# Patient Record
Sex: Female | Born: 1945
Health system: Southern US, Community
[De-identification: ages and names within clinical notes are randomized; demographics above are authoritative.]

---

## 2000-08-29 ENCOUNTER — Other Ambulatory Visit: Admission: RE | Admit: 2000-08-29 | Discharge: 2000-08-29 | Payer: Self-pay | Admitting: Gynecology

## 2002-09-25 ENCOUNTER — Other Ambulatory Visit: Admission: RE | Admit: 2002-09-25 | Discharge: 2002-09-25 | Payer: Self-pay | Admitting: Gynecology

## 2005-09-30 ENCOUNTER — Other Ambulatory Visit: Admission: RE | Admit: 2005-09-30 | Discharge: 2005-09-30 | Payer: Self-pay | Admitting: Gynecology

## 2006-06-11 ENCOUNTER — Emergency Department (HOSPITAL_COMMUNITY): Admission: EM | Admit: 2006-06-11 | Discharge: 2006-06-11 | Payer: Self-pay | Admitting: Emergency Medicine

## 2006-12-29 ENCOUNTER — Encounter: Admission: RE | Admit: 2006-12-29 | Discharge: 2006-12-29 | Payer: Self-pay | Admitting: Gynecology

## 2007-07-25 ENCOUNTER — Encounter: Admission: RE | Admit: 2007-07-25 | Discharge: 2007-07-25 | Payer: Self-pay | Admitting: Gynecology

## 2007-12-21 ENCOUNTER — Encounter: Admission: RE | Admit: 2007-12-21 | Discharge: 2007-12-21 | Payer: Self-pay | Admitting: Gynecology

## 2008-12-31 ENCOUNTER — Encounter: Admission: RE | Admit: 2008-12-31 | Discharge: 2008-12-31 | Payer: Self-pay | Admitting: Gynecology

## 2010-01-01 ENCOUNTER — Encounter: Admission: RE | Admit: 2010-01-01 | Discharge: 2010-01-01 | Payer: Self-pay | Admitting: Gynecology

## 2011-02-11 ENCOUNTER — Other Ambulatory Visit: Payer: Self-pay | Admitting: Gynecology

## 2011-02-11 DIAGNOSIS — Z1231 Encounter for screening mammogram for malignant neoplasm of breast: Secondary | ICD-10-CM

## 2011-02-23 ENCOUNTER — Ambulatory Visit
Admission: RE | Admit: 2011-02-23 | Discharge: 2011-02-23 | Disposition: A | Discharge status: Home / Self Care | Payer: Medicare Other | Source: Ambulatory Visit | Attending: Gynecology | Admitting: Gynecology

## 2011-02-23 DIAGNOSIS — Z1231 Encounter for screening mammogram for malignant neoplasm of breast: Secondary | ICD-10-CM

## 2012-03-08 ENCOUNTER — Other Ambulatory Visit: Payer: Self-pay | Admitting: Family Medicine

## 2012-03-08 DIAGNOSIS — Z1231 Encounter for screening mammogram for malignant neoplasm of breast: Secondary | ICD-10-CM

## 2012-03-27 ENCOUNTER — Ambulatory Visit
Admission: RE | Admit: 2012-03-27 | Discharge: 2012-03-27 | Disposition: A | Discharge status: Home / Self Care | Payer: Medicare Other | Source: Ambulatory Visit | Attending: Family Medicine | Admitting: Family Medicine

## 2012-03-27 DIAGNOSIS — Z1231 Encounter for screening mammogram for malignant neoplasm of breast: Secondary | ICD-10-CM

## 2012-08-30 DIAGNOSIS — J159 Unspecified bacterial pneumonia: Secondary | ICD-10-CM | POA: Diagnosis not present

## 2012-09-21 DIAGNOSIS — H52229 Regular astigmatism, unspecified eye: Secondary | ICD-10-CM | POA: Diagnosis not present

## 2012-09-21 DIAGNOSIS — H04129 Dry eye syndrome of unspecified lacrimal gland: Secondary | ICD-10-CM | POA: Diagnosis not present

## 2012-10-23 DIAGNOSIS — D485 Neoplasm of uncertain behavior of skin: Secondary | ICD-10-CM | POA: Diagnosis not present

## 2012-10-23 DIAGNOSIS — L821 Other seborrheic keratosis: Secondary | ICD-10-CM | POA: Diagnosis not present

## 2012-11-14 DIAGNOSIS — Z79899 Other long term (current) drug therapy: Secondary | ICD-10-CM | POA: Diagnosis not present

## 2012-11-14 DIAGNOSIS — E782 Mixed hyperlipidemia: Secondary | ICD-10-CM | POA: Diagnosis not present

## 2012-11-22 DIAGNOSIS — Z0000 Encounter for general adult medical examination without abnormal findings: Secondary | ICD-10-CM | POA: Diagnosis not present

## 2012-11-22 DIAGNOSIS — E782 Mixed hyperlipidemia: Secondary | ICD-10-CM | POA: Diagnosis not present

## 2012-11-22 DIAGNOSIS — I10 Essential (primary) hypertension: Secondary | ICD-10-CM | POA: Diagnosis not present

## 2013-03-23 ENCOUNTER — Other Ambulatory Visit: Payer: Self-pay

## 2013-03-23 DIAGNOSIS — Z1231 Encounter for screening mammogram for malignant neoplasm of breast: Secondary | ICD-10-CM

## 2013-04-03 DIAGNOSIS — J0100 Acute maxillary sinusitis, unspecified: Secondary | ICD-10-CM | POA: Diagnosis not present

## 2013-04-05 ENCOUNTER — Ambulatory Visit
Admission: RE | Admit: 2013-04-05 | Discharge: 2013-04-05 | Disposition: A | Discharge status: Home / Self Care | Payer: Medicare Other | Source: Ambulatory Visit

## 2013-04-05 DIAGNOSIS — Z1231 Encounter for screening mammogram for malignant neoplasm of breast: Secondary | ICD-10-CM | POA: Diagnosis not present

## 2013-04-11 ENCOUNTER — Other Ambulatory Visit: Payer: Self-pay | Admitting: Family Medicine

## 2013-04-11 DIAGNOSIS — R928 Other abnormal and inconclusive findings on diagnostic imaging of breast: Secondary | ICD-10-CM

## 2013-04-17 ENCOUNTER — Ambulatory Visit
Admission: RE | Admit: 2013-04-17 | Discharge: 2013-04-17 | Disposition: A | Discharge status: Home / Self Care | Payer: Medicare Other | Source: Ambulatory Visit | Attending: Family Medicine | Admitting: Family Medicine

## 2013-04-17 DIAGNOSIS — R928 Other abnormal and inconclusive findings on diagnostic imaging of breast: Secondary | ICD-10-CM

## 2013-04-30 ENCOUNTER — Other Ambulatory Visit: Payer: Medicare Other

## 2013-05-23 DIAGNOSIS — I10 Essential (primary) hypertension: Secondary | ICD-10-CM | POA: Diagnosis not present

## 2013-05-23 DIAGNOSIS — Z79899 Other long term (current) drug therapy: Secondary | ICD-10-CM | POA: Diagnosis not present

## 2013-05-30 ENCOUNTER — Other Ambulatory Visit: Payer: Self-pay | Admitting: Family Medicine

## 2013-05-30 DIAGNOSIS — E782 Mixed hyperlipidemia: Secondary | ICD-10-CM | POA: Diagnosis not present

## 2013-05-30 DIAGNOSIS — Z79899 Other long term (current) drug therapy: Secondary | ICD-10-CM | POA: Diagnosis not present

## 2013-05-30 DIAGNOSIS — I10 Essential (primary) hypertension: Secondary | ICD-10-CM | POA: Diagnosis not present

## 2013-05-30 DIAGNOSIS — N63 Unspecified lump in unspecified breast: Secondary | ICD-10-CM | POA: Diagnosis not present

## 2013-05-30 DIAGNOSIS — N6310 Unspecified lump in the right breast, unspecified quadrant: Secondary | ICD-10-CM

## 2013-05-30 DIAGNOSIS — Z23 Encounter for immunization: Secondary | ICD-10-CM | POA: Diagnosis not present

## 2013-06-14 ENCOUNTER — Ambulatory Visit
Admission: RE | Admit: 2013-06-14 | Discharge: 2013-06-14 | Disposition: A | Discharge status: Home / Self Care | Payer: Medicare Other | Source: Ambulatory Visit | Attending: Family Medicine | Admitting: Family Medicine

## 2013-06-14 DIAGNOSIS — N6310 Unspecified lump in the right breast, unspecified quadrant: Secondary | ICD-10-CM

## 2013-06-14 DIAGNOSIS — R928 Other abnormal and inconclusive findings on diagnostic imaging of breast: Secondary | ICD-10-CM | POA: Diagnosis not present

## 2013-06-20 DIAGNOSIS — R82998 Other abnormal findings in urine: Secondary | ICD-10-CM | POA: Diagnosis not present

## 2013-06-20 DIAGNOSIS — Z1211 Encounter for screening for malignant neoplasm of colon: Secondary | ICD-10-CM | POA: Diagnosis not present

## 2013-06-20 DIAGNOSIS — Z1272 Encounter for screening for malignant neoplasm of vagina: Secondary | ICD-10-CM | POA: Diagnosis not present

## 2013-06-20 DIAGNOSIS — Z1289 Encounter for screening for malignant neoplasm of other sites: Secondary | ICD-10-CM | POA: Diagnosis not present

## 2013-06-21 DIAGNOSIS — Z006 Encounter for examination for normal comparison and control in clinical research program: Secondary | ICD-10-CM | POA: Diagnosis not present

## 2013-06-21 DIAGNOSIS — E782 Mixed hyperlipidemia: Secondary | ICD-10-CM | POA: Diagnosis not present

## 2013-06-21 DIAGNOSIS — I10 Essential (primary) hypertension: Secondary | ICD-10-CM | POA: Diagnosis not present

## 2014-01-24 DIAGNOSIS — D235 Other benign neoplasm of skin of trunk: Secondary | ICD-10-CM | POA: Diagnosis not present

## 2014-01-24 DIAGNOSIS — L821 Other seborrheic keratosis: Secondary | ICD-10-CM | POA: Diagnosis not present

## 2014-04-29 ENCOUNTER — Other Ambulatory Visit: Payer: Self-pay

## 2014-04-29 DIAGNOSIS — Z1231 Encounter for screening mammogram for malignant neoplasm of breast: Secondary | ICD-10-CM

## 2014-05-24 ENCOUNTER — Ambulatory Visit
Admission: RE | Admit: 2014-05-24 | Discharge: 2014-05-24 | Disposition: A | Discharge status: Home / Self Care | Payer: Medicare Other | Source: Ambulatory Visit

## 2014-05-24 DIAGNOSIS — Z1231 Encounter for screening mammogram for malignant neoplasm of breast: Secondary | ICD-10-CM | POA: Diagnosis not present

## 2014-06-13 DIAGNOSIS — J069 Acute upper respiratory infection, unspecified: Secondary | ICD-10-CM | POA: Diagnosis not present

## 2014-06-13 DIAGNOSIS — J209 Acute bronchitis, unspecified: Secondary | ICD-10-CM | POA: Diagnosis not present

## 2014-06-13 DIAGNOSIS — R062 Wheezing: Secondary | ICD-10-CM | POA: Diagnosis not present

## 2014-06-26 DIAGNOSIS — E782 Mixed hyperlipidemia: Secondary | ICD-10-CM | POA: Diagnosis not present

## 2014-06-26 DIAGNOSIS — E039 Hypothyroidism, unspecified: Secondary | ICD-10-CM | POA: Diagnosis not present

## 2014-06-26 DIAGNOSIS — E559 Vitamin D deficiency, unspecified: Secondary | ICD-10-CM | POA: Diagnosis not present

## 2014-06-26 DIAGNOSIS — Z79899 Other long term (current) drug therapy: Secondary | ICD-10-CM | POA: Diagnosis not present

## 2014-07-03 DIAGNOSIS — I10 Essential (primary) hypertension: Secondary | ICD-10-CM | POA: Diagnosis not present

## 2014-07-03 DIAGNOSIS — I059 Rheumatic mitral valve disease, unspecified: Secondary | ICD-10-CM | POA: Diagnosis not present

## 2014-07-03 DIAGNOSIS — Z0000 Encounter for general adult medical examination without abnormal findings: Secondary | ICD-10-CM | POA: Diagnosis not present

## 2014-07-03 DIAGNOSIS — Z23 Encounter for immunization: Secondary | ICD-10-CM | POA: Diagnosis not present

## 2014-07-04 DIAGNOSIS — Z1211 Encounter for screening for malignant neoplasm of colon: Secondary | ICD-10-CM | POA: Diagnosis not present

## 2014-07-09 DIAGNOSIS — I361 Nonrheumatic tricuspid (valve) insufficiency: Secondary | ICD-10-CM | POA: Diagnosis not present

## 2014-07-09 DIAGNOSIS — I059 Rheumatic mitral valve disease, unspecified: Secondary | ICD-10-CM | POA: Diagnosis not present

## 2014-08-01 DIAGNOSIS — E782 Mixed hyperlipidemia: Secondary | ICD-10-CM | POA: Diagnosis not present

## 2014-08-01 DIAGNOSIS — I10 Essential (primary) hypertension: Secondary | ICD-10-CM | POA: Diagnosis not present

## 2014-09-23 DIAGNOSIS — E782 Mixed hyperlipidemia: Secondary | ICD-10-CM | POA: Diagnosis not present

## 2014-09-23 DIAGNOSIS — Z23 Encounter for immunization: Secondary | ICD-10-CM | POA: Diagnosis not present

## 2014-09-23 DIAGNOSIS — Z79899 Other long term (current) drug therapy: Secondary | ICD-10-CM | POA: Diagnosis not present

## 2014-09-23 DIAGNOSIS — I10 Essential (primary) hypertension: Secondary | ICD-10-CM | POA: Diagnosis not present

## 2014-09-24 DIAGNOSIS — E782 Mixed hyperlipidemia: Secondary | ICD-10-CM | POA: Diagnosis not present

## 2014-09-24 DIAGNOSIS — Z79899 Other long term (current) drug therapy: Secondary | ICD-10-CM | POA: Diagnosis not present

## 2014-10-23 DIAGNOSIS — I10 Essential (primary) hypertension: Secondary | ICD-10-CM | POA: Diagnosis not present

## 2014-10-23 DIAGNOSIS — N183 Chronic kidney disease, stage 3 (moderate): Secondary | ICD-10-CM | POA: Diagnosis not present

## 2014-10-23 DIAGNOSIS — Z9181 History of falling: Secondary | ICD-10-CM | POA: Diagnosis not present

## 2015-01-01 DIAGNOSIS — Z79899 Other long term (current) drug therapy: Secondary | ICD-10-CM | POA: Diagnosis not present

## 2015-01-08 DIAGNOSIS — E782 Mixed hyperlipidemia: Secondary | ICD-10-CM | POA: Diagnosis not present

## 2015-01-08 DIAGNOSIS — N183 Chronic kidney disease, stage 3 (moderate): Secondary | ICD-10-CM | POA: Diagnosis not present

## 2015-01-28 DIAGNOSIS — C44319 Basal cell carcinoma of skin of other parts of face: Secondary | ICD-10-CM | POA: Diagnosis not present

## 2015-01-28 DIAGNOSIS — L821 Other seborrheic keratosis: Secondary | ICD-10-CM | POA: Diagnosis not present

## 2015-01-28 DIAGNOSIS — L728 Other follicular cysts of the skin and subcutaneous tissue: Secondary | ICD-10-CM | POA: Diagnosis not present

## 2015-01-28 DIAGNOSIS — L820 Inflamed seborrheic keratosis: Secondary | ICD-10-CM | POA: Diagnosis not present

## 2015-02-07 DIAGNOSIS — N183 Chronic kidney disease, stage 3 (moderate): Secondary | ICD-10-CM | POA: Diagnosis not present

## 2015-02-07 DIAGNOSIS — E785 Hyperlipidemia, unspecified: Secondary | ICD-10-CM | POA: Diagnosis not present

## 2015-02-07 DIAGNOSIS — N189 Chronic kidney disease, unspecified: Secondary | ICD-10-CM | POA: Diagnosis not present

## 2015-02-07 DIAGNOSIS — I129 Hypertensive chronic kidney disease with stage 1 through stage 4 chronic kidney disease, or unspecified chronic kidney disease: Secondary | ICD-10-CM | POA: Diagnosis not present

## 2015-03-10 DIAGNOSIS — E782 Mixed hyperlipidemia: Secondary | ICD-10-CM | POA: Diagnosis not present

## 2015-03-10 DIAGNOSIS — Z79899 Other long term (current) drug therapy: Secondary | ICD-10-CM | POA: Diagnosis not present

## 2015-03-10 DIAGNOSIS — N183 Chronic kidney disease, stage 3 (moderate): Secondary | ICD-10-CM | POA: Diagnosis not present

## 2015-03-25 DIAGNOSIS — L570 Actinic keratosis: Secondary | ICD-10-CM | POA: Diagnosis not present

## 2015-03-25 DIAGNOSIS — B351 Tinea unguium: Secondary | ICD-10-CM | POA: Diagnosis not present

## 2015-03-25 DIAGNOSIS — L821 Other seborrheic keratosis: Secondary | ICD-10-CM | POA: Diagnosis not present

## 2015-03-25 DIAGNOSIS — D485 Neoplasm of uncertain behavior of skin: Secondary | ICD-10-CM | POA: Diagnosis not present

## 2015-03-26 DIAGNOSIS — H04123 Dry eye syndrome of bilateral lacrimal glands: Secondary | ICD-10-CM | POA: Diagnosis not present

## 2015-04-07 DIAGNOSIS — N289 Disorder of kidney and ureter, unspecified: Secondary | ICD-10-CM | POA: Diagnosis not present

## 2015-04-07 DIAGNOSIS — Z79899 Other long term (current) drug therapy: Secondary | ICD-10-CM | POA: Diagnosis not present

## 2015-04-07 DIAGNOSIS — B351 Tinea unguium: Secondary | ICD-10-CM | POA: Diagnosis not present

## 2015-04-07 DIAGNOSIS — E782 Mixed hyperlipidemia: Secondary | ICD-10-CM | POA: Diagnosis not present

## 2015-04-14 DIAGNOSIS — N183 Chronic kidney disease, stage 3 (moderate): Secondary | ICD-10-CM | POA: Diagnosis not present

## 2015-04-14 DIAGNOSIS — Z23 Encounter for immunization: Secondary | ICD-10-CM | POA: Diagnosis not present

## 2015-04-14 DIAGNOSIS — E782 Mixed hyperlipidemia: Secondary | ICD-10-CM | POA: Diagnosis not present

## 2015-05-09 DIAGNOSIS — N183 Chronic kidney disease, stage 3 (moderate): Secondary | ICD-10-CM | POA: Diagnosis not present

## 2015-05-16 DIAGNOSIS — N179 Acute kidney failure, unspecified: Secondary | ICD-10-CM | POA: Diagnosis not present

## 2015-05-16 DIAGNOSIS — I10 Essential (primary) hypertension: Secondary | ICD-10-CM | POA: Diagnosis not present

## 2015-06-16 ENCOUNTER — Other Ambulatory Visit: Payer: Self-pay

## 2015-06-16 DIAGNOSIS — Z1231 Encounter for screening mammogram for malignant neoplasm of breast: Secondary | ICD-10-CM

## 2015-06-26 DIAGNOSIS — D1801 Hemangioma of skin and subcutaneous tissue: Secondary | ICD-10-CM | POA: Diagnosis not present

## 2015-06-26 DIAGNOSIS — D485 Neoplasm of uncertain behavior of skin: Secondary | ICD-10-CM | POA: Diagnosis not present

## 2015-06-26 DIAGNOSIS — B351 Tinea unguium: Secondary | ICD-10-CM | POA: Diagnosis not present

## 2015-06-26 DIAGNOSIS — L570 Actinic keratosis: Secondary | ICD-10-CM | POA: Diagnosis not present

## 2015-07-18 ENCOUNTER — Ambulatory Visit
Admission: RE | Admit: 2015-07-18 | Discharge: 2015-07-18 | Disposition: A | Discharge status: Home / Self Care | Payer: Medicare Other | Source: Ambulatory Visit

## 2015-07-18 DIAGNOSIS — Z1231 Encounter for screening mammogram for malignant neoplasm of breast: Secondary | ICD-10-CM | POA: Diagnosis not present

## 2015-07-23 DIAGNOSIS — Z01419 Encounter for gynecological examination (general) (routine) without abnormal findings: Secondary | ICD-10-CM | POA: Diagnosis not present

## 2015-07-23 DIAGNOSIS — Z1272 Encounter for screening for malignant neoplasm of vagina: Secondary | ICD-10-CM | POA: Diagnosis not present

## 2015-09-18 DIAGNOSIS — Z79899 Other long term (current) drug therapy: Secondary | ICD-10-CM | POA: Diagnosis not present

## 2015-09-18 DIAGNOSIS — E782 Mixed hyperlipidemia: Secondary | ICD-10-CM | POA: Diagnosis not present

## 2015-09-18 DIAGNOSIS — E039 Hypothyroidism, unspecified: Secondary | ICD-10-CM | POA: Diagnosis not present

## 2015-09-25 DIAGNOSIS — E039 Hypothyroidism, unspecified: Secondary | ICD-10-CM | POA: Diagnosis not present

## 2015-09-25 DIAGNOSIS — Z0000 Encounter for general adult medical examination without abnormal findings: Secondary | ICD-10-CM | POA: Diagnosis not present

## 2015-09-25 DIAGNOSIS — I10 Essential (primary) hypertension: Secondary | ICD-10-CM | POA: Diagnosis not present

## 2015-09-25 DIAGNOSIS — E782 Mixed hyperlipidemia: Secondary | ICD-10-CM | POA: Diagnosis not present

## 2015-09-25 DIAGNOSIS — N183 Chronic kidney disease, stage 3 (moderate): Secondary | ICD-10-CM | POA: Diagnosis not present

## 2015-11-07 DIAGNOSIS — N179 Acute kidney failure, unspecified: Secondary | ICD-10-CM | POA: Diagnosis not present

## 2015-11-14 DIAGNOSIS — N183 Chronic kidney disease, stage 3 unspecified: Secondary | ICD-10-CM | POA: Insufficient documentation

## 2015-11-14 DIAGNOSIS — I129 Hypertensive chronic kidney disease with stage 1 through stage 4 chronic kidney disease, or unspecified chronic kidney disease: Secondary | ICD-10-CM | POA: Diagnosis not present

## 2016-01-29 DIAGNOSIS — L814 Other melanin hyperpigmentation: Secondary | ICD-10-CM | POA: Diagnosis not present

## 2016-01-29 DIAGNOSIS — D1801 Hemangioma of skin and subcutaneous tissue: Secondary | ICD-10-CM | POA: Diagnosis not present

## 2016-01-29 DIAGNOSIS — L821 Other seborrheic keratosis: Secondary | ICD-10-CM | POA: Diagnosis not present

## 2016-03-01 DIAGNOSIS — E782 Mixed hyperlipidemia: Secondary | ICD-10-CM | POA: Diagnosis not present

## 2016-03-03 DIAGNOSIS — Z9181 History of falling: Secondary | ICD-10-CM | POA: Diagnosis not present

## 2016-03-03 DIAGNOSIS — N289 Disorder of kidney and ureter, unspecified: Secondary | ICD-10-CM | POA: Diagnosis not present

## 2016-03-03 DIAGNOSIS — I10 Essential (primary) hypertension: Secondary | ICD-10-CM | POA: Diagnosis not present

## 2016-03-03 DIAGNOSIS — Z1389 Encounter for screening for other disorder: Secondary | ICD-10-CM | POA: Diagnosis not present

## 2016-05-25 DIAGNOSIS — J0100 Acute maxillary sinusitis, unspecified: Secondary | ICD-10-CM | POA: Diagnosis not present

## 2016-06-30 DIAGNOSIS — Z23 Encounter for immunization: Secondary | ICD-10-CM | POA: Diagnosis not present

## 2016-07-10 DIAGNOSIS — N183 Chronic kidney disease, stage 3 (moderate): Secondary | ICD-10-CM | POA: Diagnosis not present

## 2016-07-19 DIAGNOSIS — I129 Hypertensive chronic kidney disease with stage 1 through stage 4 chronic kidney disease, or unspecified chronic kidney disease: Secondary | ICD-10-CM | POA: Diagnosis not present

## 2016-07-19 DIAGNOSIS — E559 Vitamin D deficiency, unspecified: Secondary | ICD-10-CM | POA: Diagnosis not present

## 2016-07-19 DIAGNOSIS — N183 Chronic kidney disease, stage 3 (moderate): Secondary | ICD-10-CM | POA: Diagnosis not present

## 2016-07-27 ENCOUNTER — Other Ambulatory Visit: Payer: Self-pay | Admitting: Family Medicine

## 2016-07-27 DIAGNOSIS — Z1231 Encounter for screening mammogram for malignant neoplasm of breast: Secondary | ICD-10-CM

## 2016-09-10 ENCOUNTER — Ambulatory Visit
Admission: RE | Admit: 2016-09-10 | Discharge: 2016-09-10 | Disposition: A | Discharge status: Home / Self Care | Payer: Medicare Other | Source: Ambulatory Visit | Attending: Family Medicine | Admitting: Family Medicine

## 2016-09-10 DIAGNOSIS — Z1231 Encounter for screening mammogram for malignant neoplasm of breast: Secondary | ICD-10-CM | POA: Diagnosis not present

## 2016-09-28 DIAGNOSIS — E782 Mixed hyperlipidemia: Secondary | ICD-10-CM | POA: Diagnosis not present

## 2016-09-28 DIAGNOSIS — Z79899 Other long term (current) drug therapy: Secondary | ICD-10-CM | POA: Diagnosis not present

## 2016-10-04 DIAGNOSIS — Z79899 Other long term (current) drug therapy: Secondary | ICD-10-CM | POA: Diagnosis not present

## 2016-10-04 DIAGNOSIS — I059 Rheumatic mitral valve disease, unspecified: Secondary | ICD-10-CM | POA: Diagnosis not present

## 2016-10-04 DIAGNOSIS — M8580 Other specified disorders of bone density and structure, unspecified site: Secondary | ICD-10-CM | POA: Diagnosis not present

## 2016-10-04 DIAGNOSIS — E039 Hypothyroidism, unspecified: Secondary | ICD-10-CM | POA: Diagnosis not present

## 2016-10-04 DIAGNOSIS — E782 Mixed hyperlipidemia: Secondary | ICD-10-CM | POA: Diagnosis not present

## 2016-10-04 DIAGNOSIS — I10 Essential (primary) hypertension: Secondary | ICD-10-CM | POA: Diagnosis not present

## 2016-10-04 DIAGNOSIS — E559 Vitamin D deficiency, unspecified: Secondary | ICD-10-CM | POA: Diagnosis not present

## 2016-10-04 DIAGNOSIS — Z0000 Encounter for general adult medical examination without abnormal findings: Secondary | ICD-10-CM | POA: Diagnosis not present

## 2016-10-04 DIAGNOSIS — N183 Chronic kidney disease, stage 3 (moderate): Secondary | ICD-10-CM | POA: Diagnosis not present

## 2016-10-08 DIAGNOSIS — Z1211 Encounter for screening for malignant neoplasm of colon: Secondary | ICD-10-CM | POA: Diagnosis not present

## 2016-12-27 DIAGNOSIS — Z79899 Other long term (current) drug therapy: Secondary | ICD-10-CM | POA: Diagnosis not present

## 2016-12-27 DIAGNOSIS — E782 Mixed hyperlipidemia: Secondary | ICD-10-CM | POA: Diagnosis not present

## 2017-01-03 DIAGNOSIS — I129 Hypertensive chronic kidney disease with stage 1 through stage 4 chronic kidney disease, or unspecified chronic kidney disease: Secondary | ICD-10-CM | POA: Diagnosis not present

## 2017-01-03 DIAGNOSIS — N183 Chronic kidney disease, stage 3 (moderate): Secondary | ICD-10-CM | POA: Diagnosis not present

## 2017-01-03 DIAGNOSIS — E782 Mixed hyperlipidemia: Secondary | ICD-10-CM | POA: Diagnosis not present

## 2017-01-03 DIAGNOSIS — I059 Rheumatic mitral valve disease, unspecified: Secondary | ICD-10-CM | POA: Diagnosis not present

## 2017-01-06 DIAGNOSIS — R5381 Other malaise: Secondary | ICD-10-CM | POA: Diagnosis not present

## 2017-01-06 DIAGNOSIS — N3000 Acute cystitis without hematuria: Secondary | ICD-10-CM | POA: Diagnosis not present

## 2017-01-06 DIAGNOSIS — R1030 Lower abdominal pain, unspecified: Secondary | ICD-10-CM | POA: Diagnosis not present

## 2017-01-10 DIAGNOSIS — J984 Other disorders of lung: Secondary | ICD-10-CM | POA: Diagnosis not present

## 2017-01-10 DIAGNOSIS — I10 Essential (primary) hypertension: Secondary | ICD-10-CM | POA: Diagnosis not present

## 2017-01-10 DIAGNOSIS — R911 Solitary pulmonary nodule: Secondary | ICD-10-CM | POA: Diagnosis not present

## 2017-01-10 DIAGNOSIS — E785 Hyperlipidemia, unspecified: Secondary | ICD-10-CM | POA: Diagnosis not present

## 2017-01-10 DIAGNOSIS — R42 Dizziness and giddiness: Secondary | ICD-10-CM | POA: Diagnosis not present

## 2017-01-10 DIAGNOSIS — R079 Chest pain, unspecified: Secondary | ICD-10-CM | POA: Diagnosis not present

## 2017-01-10 DIAGNOSIS — R7989 Other specified abnormal findings of blood chemistry: Secondary | ICD-10-CM | POA: Diagnosis not present

## 2017-01-10 DIAGNOSIS — N179 Acute kidney failure, unspecified: Secondary | ICD-10-CM | POA: Diagnosis not present

## 2017-01-10 DIAGNOSIS — R000 Tachycardia, unspecified: Secondary | ICD-10-CM | POA: Diagnosis not present

## 2017-01-10 DIAGNOSIS — I471 Supraventricular tachycardia, unspecified: Secondary | ICD-10-CM | POA: Insufficient documentation

## 2017-01-10 DIAGNOSIS — R938 Abnormal findings on diagnostic imaging of other specified body structures: Secondary | ICD-10-CM | POA: Diagnosis not present

## 2017-01-10 DIAGNOSIS — R748 Abnormal levels of other serum enzymes: Secondary | ICD-10-CM | POA: Diagnosis not present

## 2017-01-10 DIAGNOSIS — R9389 Abnormal findings on diagnostic imaging of other specified body structures: Secondary | ICD-10-CM | POA: Insufficient documentation

## 2017-01-10 DIAGNOSIS — R918 Other nonspecific abnormal finding of lung field: Secondary | ICD-10-CM | POA: Diagnosis not present

## 2017-01-11 DIAGNOSIS — R911 Solitary pulmonary nodule: Secondary | ICD-10-CM | POA: Diagnosis not present

## 2017-01-11 DIAGNOSIS — R918 Other nonspecific abnormal finding of lung field: Secondary | ICD-10-CM | POA: Diagnosis not present

## 2017-01-11 DIAGNOSIS — I351 Nonrheumatic aortic (valve) insufficiency: Secondary | ICD-10-CM | POA: Diagnosis not present

## 2017-01-11 DIAGNOSIS — R7989 Other specified abnormal findings of blood chemistry: Secondary | ICD-10-CM | POA: Diagnosis not present

## 2017-01-11 DIAGNOSIS — I471 Supraventricular tachycardia: Secondary | ICD-10-CM | POA: Diagnosis not present

## 2017-01-11 DIAGNOSIS — J189 Pneumonia, unspecified organism: Secondary | ICD-10-CM | POA: Diagnosis not present

## 2017-01-11 DIAGNOSIS — R748 Abnormal levels of other serum enzymes: Secondary | ICD-10-CM | POA: Diagnosis not present

## 2017-01-11 DIAGNOSIS — E785 Hyperlipidemia, unspecified: Secondary | ICD-10-CM | POA: Diagnosis not present

## 2017-01-11 DIAGNOSIS — I10 Essential (primary) hypertension: Secondary | ICD-10-CM | POA: Diagnosis not present

## 2017-01-11 DIAGNOSIS — N179 Acute kidney failure, unspecified: Secondary | ICD-10-CM | POA: Diagnosis not present

## 2017-01-11 DIAGNOSIS — I368 Other nonrheumatic tricuspid valve disorders: Secondary | ICD-10-CM | POA: Diagnosis not present

## 2017-01-14 DIAGNOSIS — R938 Abnormal findings on diagnostic imaging of other specified body structures: Secondary | ICD-10-CM | POA: Diagnosis not present

## 2017-01-14 DIAGNOSIS — Z8679 Personal history of other diseases of the circulatory system: Secondary | ICD-10-CM | POA: Diagnosis not present

## 2017-01-14 DIAGNOSIS — Z6821 Body mass index (BMI) 21.0-21.9, adult: Secondary | ICD-10-CM | POA: Diagnosis not present

## 2017-01-26 DIAGNOSIS — I471 Supraventricular tachycardia: Secondary | ICD-10-CM | POA: Diagnosis not present

## 2017-02-23 DIAGNOSIS — I471 Supraventricular tachycardia: Secondary | ICD-10-CM | POA: Diagnosis not present

## 2017-02-23 DIAGNOSIS — Z8679 Personal history of other diseases of the circulatory system: Secondary | ICD-10-CM | POA: Diagnosis not present

## 2017-02-23 DIAGNOSIS — I10 Essential (primary) hypertension: Secondary | ICD-10-CM | POA: Insufficient documentation

## 2017-02-23 DIAGNOSIS — E785 Hyperlipidemia, unspecified: Secondary | ICD-10-CM | POA: Insufficient documentation

## 2017-02-23 DIAGNOSIS — E784 Other hyperlipidemia: Secondary | ICD-10-CM | POA: Diagnosis not present

## 2017-02-23 DIAGNOSIS — N183 Chronic kidney disease, stage 3 (moderate): Secondary | ICD-10-CM | POA: Diagnosis not present

## 2017-03-10 DIAGNOSIS — L570 Actinic keratosis: Secondary | ICD-10-CM | POA: Diagnosis not present

## 2017-03-10 DIAGNOSIS — L821 Other seborrheic keratosis: Secondary | ICD-10-CM | POA: Diagnosis not present

## 2017-03-10 DIAGNOSIS — L814 Other melanin hyperpigmentation: Secondary | ICD-10-CM | POA: Diagnosis not present

## 2017-03-10 DIAGNOSIS — L300 Nummular dermatitis: Secondary | ICD-10-CM | POA: Diagnosis not present

## 2017-03-29 DIAGNOSIS — E559 Vitamin D deficiency, unspecified: Secondary | ICD-10-CM | POA: Diagnosis not present

## 2017-03-29 DIAGNOSIS — E782 Mixed hyperlipidemia: Secondary | ICD-10-CM | POA: Diagnosis not present

## 2017-03-29 DIAGNOSIS — Z79899 Other long term (current) drug therapy: Secondary | ICD-10-CM | POA: Diagnosis not present

## 2017-04-04 DIAGNOSIS — I129 Hypertensive chronic kidney disease with stage 1 through stage 4 chronic kidney disease, or unspecified chronic kidney disease: Secondary | ICD-10-CM | POA: Diagnosis not present

## 2017-04-04 DIAGNOSIS — N183 Chronic kidney disease, stage 3 (moderate): Secondary | ICD-10-CM | POA: Diagnosis not present

## 2017-04-04 DIAGNOSIS — Z1389 Encounter for screening for other disorder: Secondary | ICD-10-CM | POA: Diagnosis not present

## 2017-04-04 DIAGNOSIS — Z9181 History of falling: Secondary | ICD-10-CM | POA: Diagnosis not present

## 2017-06-06 DIAGNOSIS — H2513 Age-related nuclear cataract, bilateral: Secondary | ICD-10-CM | POA: Diagnosis not present

## 2017-06-06 DIAGNOSIS — H5203 Hypermetropia, bilateral: Secondary | ICD-10-CM | POA: Diagnosis not present

## 2017-06-10 DIAGNOSIS — R918 Other nonspecific abnormal finding of lung field: Secondary | ICD-10-CM | POA: Diagnosis not present

## 2017-06-30 DIAGNOSIS — L650 Telogen effluvium: Secondary | ICD-10-CM | POA: Diagnosis not present

## 2017-07-12 DIAGNOSIS — N183 Chronic kidney disease, stage 3 (moderate): Secondary | ICD-10-CM | POA: Diagnosis not present

## 2017-07-12 DIAGNOSIS — I129 Hypertensive chronic kidney disease with stage 1 through stage 4 chronic kidney disease, or unspecified chronic kidney disease: Secondary | ICD-10-CM | POA: Diagnosis not present

## 2017-07-12 DIAGNOSIS — I471 Supraventricular tachycardia: Secondary | ICD-10-CM | POA: Diagnosis not present

## 2017-07-26 DIAGNOSIS — Z79899 Other long term (current) drug therapy: Secondary | ICD-10-CM | POA: Diagnosis not present

## 2017-07-26 DIAGNOSIS — E039 Hypothyroidism, unspecified: Secondary | ICD-10-CM | POA: Diagnosis not present

## 2017-07-26 DIAGNOSIS — E782 Mixed hyperlipidemia: Secondary | ICD-10-CM | POA: Diagnosis not present

## 2017-07-26 DIAGNOSIS — Z23 Encounter for immunization: Secondary | ICD-10-CM | POA: Diagnosis not present

## 2017-08-03 DIAGNOSIS — M859 Disorder of bone density and structure, unspecified: Secondary | ICD-10-CM | POA: Diagnosis not present

## 2017-08-03 DIAGNOSIS — Z13820 Encounter for screening for osteoporosis: Secondary | ICD-10-CM | POA: Diagnosis not present

## 2017-08-03 DIAGNOSIS — Z2821 Immunization not carried out because of patient refusal: Secondary | ICD-10-CM | POA: Diagnosis not present

## 2017-08-03 DIAGNOSIS — I10 Essential (primary) hypertension: Secondary | ICD-10-CM | POA: Diagnosis not present

## 2017-08-03 DIAGNOSIS — E782 Mixed hyperlipidemia: Secondary | ICD-10-CM | POA: Diagnosis not present

## 2017-08-03 DIAGNOSIS — M8580 Other specified disorders of bone density and structure, unspecified site: Secondary | ICD-10-CM | POA: Diagnosis not present

## 2017-08-17 DIAGNOSIS — I130 Hypertensive heart and chronic kidney disease with heart failure and stage 1 through stage 4 chronic kidney disease, or unspecified chronic kidney disease: Secondary | ICD-10-CM | POA: Diagnosis not present

## 2017-08-17 DIAGNOSIS — N183 Chronic kidney disease, stage 3 (moderate): Secondary | ICD-10-CM | POA: Diagnosis not present

## 2017-08-17 DIAGNOSIS — Z6821 Body mass index (BMI) 21.0-21.9, adult: Secondary | ICD-10-CM | POA: Diagnosis not present

## 2017-08-26 DIAGNOSIS — N183 Chronic kidney disease, stage 3 (moderate): Secondary | ICD-10-CM | POA: Diagnosis not present

## 2017-09-02 DIAGNOSIS — N183 Chronic kidney disease, stage 3 (moderate): Secondary | ICD-10-CM | POA: Diagnosis not present

## 2017-09-02 DIAGNOSIS — I129 Hypertensive chronic kidney disease with stage 1 through stage 4 chronic kidney disease, or unspecified chronic kidney disease: Secondary | ICD-10-CM | POA: Diagnosis not present

## 2017-09-29 ENCOUNTER — Other Ambulatory Visit: Payer: Self-pay | Admitting: Family Medicine

## 2017-09-29 DIAGNOSIS — Z1231 Encounter for screening mammogram for malignant neoplasm of breast: Secondary | ICD-10-CM

## 2017-10-06 DIAGNOSIS — I10 Essential (primary) hypertension: Secondary | ICD-10-CM | POA: Diagnosis not present

## 2017-10-06 DIAGNOSIS — Z0000 Encounter for general adult medical examination without abnormal findings: Secondary | ICD-10-CM | POA: Diagnosis not present

## 2017-10-06 DIAGNOSIS — G4700 Insomnia, unspecified: Secondary | ICD-10-CM | POA: Diagnosis not present

## 2017-10-06 DIAGNOSIS — Z6821 Body mass index (BMI) 21.0-21.9, adult: Secondary | ICD-10-CM | POA: Diagnosis not present

## 2017-10-28 ENCOUNTER — Ambulatory Visit
Admission: RE | Admit: 2017-10-28 | Discharge: 2017-10-28 | Disposition: A | Discharge status: Home / Self Care | Payer: Medicare Other | Source: Ambulatory Visit | Attending: Family Medicine | Admitting: Family Medicine

## 2017-10-28 DIAGNOSIS — Z1231 Encounter for screening mammogram for malignant neoplasm of breast: Secondary | ICD-10-CM

## 2018-01-11 DIAGNOSIS — I471 Supraventricular tachycardia: Secondary | ICD-10-CM | POA: Diagnosis not present

## 2018-01-11 DIAGNOSIS — E7849 Other hyperlipidemia: Secondary | ICD-10-CM | POA: Diagnosis not present

## 2018-01-11 DIAGNOSIS — I4891 Unspecified atrial fibrillation: Secondary | ICD-10-CM | POA: Diagnosis not present

## 2018-01-11 DIAGNOSIS — I129 Hypertensive chronic kidney disease with stage 1 through stage 4 chronic kidney disease, or unspecified chronic kidney disease: Secondary | ICD-10-CM | POA: Diagnosis not present

## 2018-01-11 DIAGNOSIS — R9431 Abnormal electrocardiogram [ECG] [EKG]: Secondary | ICD-10-CM | POA: Diagnosis not present

## 2018-01-11 DIAGNOSIS — N183 Chronic kidney disease, stage 3 (moderate): Secondary | ICD-10-CM | POA: Diagnosis not present

## 2018-01-25 DIAGNOSIS — E039 Hypothyroidism, unspecified: Secondary | ICD-10-CM | POA: Diagnosis not present

## 2018-01-25 DIAGNOSIS — Z79899 Other long term (current) drug therapy: Secondary | ICD-10-CM | POA: Diagnosis not present

## 2018-01-25 DIAGNOSIS — E559 Vitamin D deficiency, unspecified: Secondary | ICD-10-CM | POA: Diagnosis not present

## 2018-01-25 DIAGNOSIS — E782 Mixed hyperlipidemia: Secondary | ICD-10-CM | POA: Diagnosis not present

## 2018-01-25 DIAGNOSIS — I10 Essential (primary) hypertension: Secondary | ICD-10-CM | POA: Diagnosis not present

## 2018-02-01 DIAGNOSIS — N183 Chronic kidney disease, stage 3 (moderate): Secondary | ICD-10-CM | POA: Diagnosis not present

## 2018-02-01 DIAGNOSIS — I129 Hypertensive chronic kidney disease with stage 1 through stage 4 chronic kidney disease, or unspecified chronic kidney disease: Secondary | ICD-10-CM | POA: Diagnosis not present

## 2018-02-01 DIAGNOSIS — I10 Essential (primary) hypertension: Secondary | ICD-10-CM | POA: Diagnosis not present

## 2018-02-01 DIAGNOSIS — Z6821 Body mass index (BMI) 21.0-21.9, adult: Secondary | ICD-10-CM | POA: Diagnosis not present

## 2018-03-01 DIAGNOSIS — L280 Lichen simplex chronicus: Secondary | ICD-10-CM | POA: Diagnosis not present

## 2018-03-01 DIAGNOSIS — L570 Actinic keratosis: Secondary | ICD-10-CM | POA: Diagnosis not present

## 2018-03-15 DIAGNOSIS — E782 Mixed hyperlipidemia: Secondary | ICD-10-CM | POA: Diagnosis not present

## 2018-03-15 DIAGNOSIS — E039 Hypothyroidism, unspecified: Secondary | ICD-10-CM | POA: Diagnosis not present

## 2018-04-03 DIAGNOSIS — Z79899 Other long term (current) drug therapy: Secondary | ICD-10-CM | POA: Diagnosis not present

## 2018-04-03 DIAGNOSIS — E782 Mixed hyperlipidemia: Secondary | ICD-10-CM | POA: Diagnosis not present

## 2018-04-06 DIAGNOSIS — Z1331 Encounter for screening for depression: Secondary | ICD-10-CM | POA: Diagnosis not present

## 2018-04-06 DIAGNOSIS — N183 Chronic kidney disease, stage 3 (moderate): Secondary | ICD-10-CM | POA: Diagnosis not present

## 2018-04-06 DIAGNOSIS — Z6821 Body mass index (BMI) 21.0-21.9, adult: Secondary | ICD-10-CM | POA: Diagnosis not present

## 2018-04-06 DIAGNOSIS — I129 Hypertensive chronic kidney disease with stage 1 through stage 4 chronic kidney disease, or unspecified chronic kidney disease: Secondary | ICD-10-CM | POA: Diagnosis not present

## 2018-04-13 DIAGNOSIS — F411 Generalized anxiety disorder: Secondary | ICD-10-CM | POA: Diagnosis not present

## 2018-04-13 DIAGNOSIS — E782 Mixed hyperlipidemia: Secondary | ICD-10-CM | POA: Diagnosis not present

## 2018-04-13 DIAGNOSIS — I130 Hypertensive heart and chronic kidney disease with heart failure and stage 1 through stage 4 chronic kidney disease, or unspecified chronic kidney disease: Secondary | ICD-10-CM | POA: Diagnosis not present

## 2018-05-15 DIAGNOSIS — E782 Mixed hyperlipidemia: Secondary | ICD-10-CM | POA: Diagnosis not present

## 2018-05-15 DIAGNOSIS — I10 Essential (primary) hypertension: Secondary | ICD-10-CM | POA: Diagnosis not present

## 2018-06-01 DIAGNOSIS — L578 Other skin changes due to chronic exposure to nonionizing radiation: Secondary | ICD-10-CM | POA: Diagnosis not present

## 2018-06-01 DIAGNOSIS — L570 Actinic keratosis: Secondary | ICD-10-CM | POA: Diagnosis not present

## 2018-06-01 DIAGNOSIS — D1801 Hemangioma of skin and subcutaneous tissue: Secondary | ICD-10-CM | POA: Diagnosis not present

## 2018-06-01 DIAGNOSIS — L821 Other seborrheic keratosis: Secondary | ICD-10-CM | POA: Diagnosis not present

## 2018-06-05 DIAGNOSIS — Z79899 Other long term (current) drug therapy: Secondary | ICD-10-CM | POA: Diagnosis not present

## 2018-06-05 DIAGNOSIS — E782 Mixed hyperlipidemia: Secondary | ICD-10-CM | POA: Diagnosis not present

## 2018-06-08 DIAGNOSIS — Z23 Encounter for immunization: Secondary | ICD-10-CM | POA: Diagnosis not present

## 2018-06-08 DIAGNOSIS — R911 Solitary pulmonary nodule: Secondary | ICD-10-CM | POA: Diagnosis not present

## 2018-06-08 DIAGNOSIS — Z1331 Encounter for screening for depression: Secondary | ICD-10-CM | POA: Diagnosis not present

## 2018-06-08 DIAGNOSIS — I129 Hypertensive chronic kidney disease with stage 1 through stage 4 chronic kidney disease, or unspecified chronic kidney disease: Secondary | ICD-10-CM | POA: Diagnosis not present

## 2018-06-08 DIAGNOSIS — Z9181 History of falling: Secondary | ICD-10-CM | POA: Diagnosis not present

## 2018-06-15 DIAGNOSIS — F411 Generalized anxiety disorder: Secondary | ICD-10-CM | POA: Diagnosis not present

## 2018-06-15 DIAGNOSIS — E782 Mixed hyperlipidemia: Secondary | ICD-10-CM | POA: Diagnosis not present

## 2018-06-23 DIAGNOSIS — I700 Atherosclerosis of aorta: Secondary | ICD-10-CM | POA: Diagnosis not present

## 2018-06-23 DIAGNOSIS — R918 Other nonspecific abnormal finding of lung field: Secondary | ICD-10-CM | POA: Diagnosis not present

## 2018-06-23 DIAGNOSIS — R911 Solitary pulmonary nodule: Secondary | ICD-10-CM | POA: Diagnosis not present

## 2018-06-29 DIAGNOSIS — J0100 Acute maxillary sinusitis, unspecified: Secondary | ICD-10-CM | POA: Diagnosis not present

## 2018-07-06 ENCOUNTER — Other Ambulatory Visit: Payer: Self-pay

## 2018-07-25 ENCOUNTER — Ambulatory Visit (INDEPENDENT_AMBULATORY_CARE_PROVIDER_SITE_OTHER): Payer: Medicare Other | Admitting: Internal Medicine

## 2018-07-25 ENCOUNTER — Encounter: Payer: Self-pay | Admitting: Internal Medicine

## 2018-07-25 VITALS — BP 132/86 | HR 76 | Ht 63.5 in | Wt 128.8 lb

## 2018-07-25 DIAGNOSIS — R918 Other nonspecific abnormal finding of lung field: Secondary | ICD-10-CM | POA: Diagnosis not present

## 2018-07-25 NOTE — Patient Instructions (Addendum)
We will call you the first week in Nov 2020 to arrange a follow up CT chest at our building on [address]  - we will try to get it for the am and see you after lunch.   However, any unexplained fever, cough, pain when you breath or coughing up blood > call right away for follow up  - needs esr/ Quant GOLD TB testing on return

## 2018-07-25 NOTE — Progress Notes (Signed)
Angela Ball, female    DOB: 04/01/1946,     MRN: [id number]    Brief patient profile:  79 yowf never smoker acute  fever May of 2018 ? Maybe  Sinus ? Maybe Urine > abx > palpitations > [place] eval with abnormalities on CT > f/u in [place] > migrating very small GG changes and persistent  1.5 cm gg density in RUL  s any resp or systemic symptoms suggesting acute or chronic illness so referred to pulmonary clinic 07/25/2018 by Dr   Ann Held.  History of Present Illness  07/25/2018  Pulmonary/ 1st office eval/[facility]  Chief Complaint  Patient presents with  . Pulmonary Consult    Referred by Dr. Ann Held for eval of pulmonary nodule. She denies any respiratory co's.  Dyspnea:  Very active [place] no sob  Cough: no chronic  Sleep: ok flat one pillow SABA use:  No    No obvious day to day or daytime variability or assoc excess/ purulent sputum or mucus plugs or hemoptysis or cp or chest tightness, subjective wheeze or overt sinus or hb symptoms.   Sleeping as above without nocturnal  or early am exacerbation  of respiratory  c/o's or need for noct saba. Also denies any obvious fluctuation of symptoms with weather or environmental changes or other aggravating or alleviating factors except as outlined above   No unusual exposure hx or h/o childhood pna/ asthma or knowledge of premature birth.  Current Allergies, Complete Past Medical History, Past Surgical History, Family History, and Social History were reviewed in [employer] record.  ROS  The following are not active complaints unless bolded Hoarseness, sore throat, dysphagia, dental problems, itching, sneezing,  nasal congestion or discharge of excess mucus or purulent secretions, ear ache,   fever, chills, sweats, unintended wt loss or wt gain, classically pleuritic or exertional cp,  orthopnea pnd or arm/hand swelling  or leg swelling, presyncope, palpitations, abdominal pain, anorexia, nausea,  vomiting, diarrhea  or change in bowel habits or change in bladder habits, change in stools or change in urine, dysuria, hematuria,  rash, arthralgias, visual complaints, headache, numbness, weakness or ataxia or problems with walking or coordination,  change in mood or  memory.           No past medical history on file.  Outpatient Medications Prior to Visit  Medication Sig Dispense Refill  . Biotin 10 MG TABS Take 1 tablet by mouth daily.    . Cholecalciferol (CVS D3) 50 MCG (2000 UT) CAPS Take 2,000 Units by mouth daily.    . cloNIDine (CATAPRES) 0.1 MG tablet Take 0.1 mg by mouth 2 (two) times daily.    . Coenzyme Q10 (CO Q 10) 100 MG CAPS Take 200 mg by mouth daily.    Marland Kitchen ESCITALOPRAM OXALATE PO Take 1 tablet by mouth daily.    Marland Kitchen ezetimibe (ZETIA) 10 MG tablet Take 10 mg by mouth daily.    . metoprolol succinate (TOPROL-XL) 25 MG 24 hr tablet Take 25 mg by mouth daily.    . Multiple Vitamins-Minerals (HAIR SKIN AND NAILS FORMULA PO) Take by mouth daily.    . Omega-3 Fatty Acids (SALMON OIL PO) Take 1 capsule by mouth daily.    . rosuvastatin (CRESTOR) 5 MG tablet Take 5 mg by mouth daily.     No facility-administered medications prior to visit.      Objective:     BP 132/86 (BP Location: Left Arm, Cuff Size: Normal)  Pulse 76   Ht 5' 3.5" (1.613 m)   Wt 128 lb 12.8 oz (58.4 kg)   SpO2 99%   BMI 22.46 kg/m   SpO2: 99 %   RA    HEENT: nl dentition, turbinates bilaterally, and oropharynx. Nl external ear canals without cough reflex   NECK :  without JVD/Nodes/TM/ nl carotid upstrokes bilaterally   LUNGS: no acc muscle use,  Nl contour chest which is clear to A and P bilaterally without cough on insp or exp maneuvers   CV:  RRR  no s3 or murmur or increase in P2, and no edema   ABD:  soft and nontender with nl inspiratory excursion in the supine position. No bruits or organomegaly appreciated, bowel sounds nl  MS:  Nl gait/ ext warm without deformities, calf  tenderness, cyanosis or clubbing No obvious joint restrictions   SKIN: warm and dry without lesions    NEURO:  alert, approp, nl sensorium with  no motor or cerebellar deficits apparent.     I personally reviewed images and agree with radiology impression as follows:   Chest CT w/o contrast 06/22/18 vs 06/10/17  mpns all gg in nature vary in size some new, some old ones have resolved with only one persistent nodule of concern = 1.5 cm RUL GG density      Assessment   Multiple pulmonary nodules determined by computed tomography of lung - F/u CT 07/03/19 in reminder file   Although there are clearly abnormalities on CT scan, they should probably be considered "microscopic" since not obvious on plain cxr .    She has no "macroscopic" health issues that I can identify and there is no clinical indication to do any kind of a biopsy at this point  but I suspect she will turn out to have very low grade MAI on serial f/u.   This is an extremely common benign condition in the elderly and does not warrant aggressive eval/ rx at this point unless there is a clinical correlation suggesting unaddressed pulmonary infection (purulent sputum, night sweats, unintended wt loss, doe) or evolution of  obvious changes on plain cxr (as opposed to serial CT, which is way over sensitive to make clinical decisions re intervention and treatment in the elderly, who tend to tolerate both dx and treatment poorly) .   >>>> If she does progress to overt symptoms, there should be some obvious change on a  plain cxr and could be approached with BAL or just prn abx in the form of fluroquinolones initially.  Would need a baseline esr/ quant GOLD TB first for baseline    >>> If she has no symptoms over the next year, I would just follow the fleischner society guidelines as per Radiology rec:  One year with ct > placed in our reminder file.    Discussed in detail all the  indications, usual  risks and alternatives   relative to the benefits with patient who agrees to proceed with conservative f/u as outlined      Total time devoted to counseling  > 50 % of initial 60 min office visit:  review case with pt/ discussion of options/alternatives/ personally creating written customized instructions  in presence of pt  then going over those specific  Instructions directly with the pt including how to use all of the meds but in particular covering each new medication in detail and the difference between the maintenance= "automatic" meds and the prns using an action plan format for  the latter (If this problem/symptom => do that organization reading Left to right).  Please see AVS from this visit for a full list of these instructions which I personally wrote for this pt and  are unique to this visit.      Christinia Gully, MD 07/25/2018

## 2018-07-26 ENCOUNTER — Encounter: Payer: Self-pay | Admitting: Internal Medicine

## 2018-07-26 DIAGNOSIS — R918 Other nonspecific abnormal finding of lung field: Secondary | ICD-10-CM | POA: Insufficient documentation

## 2018-07-26 NOTE — Assessment & Plan Note (Signed)
Although there are clearly abnormalities on CT scan, they should probably be considered "microscopic" since not obvious on plain cxr .    She has no "macroscopic" health issues that I can identify and there is no clinical indication to do any kind of a biopsy at this point  but I suspect she will turn out to have very low grade MAI on serial f/u.   This is an extremely common benign condition in the elderly and does not warrant aggressive eval/ rx at this point unless there is a clinical correlation suggesting unaddressed pulmonary infection (purulent sputum, night sweats, unintended wt loss, doe) or evolution of  obvious changes on plain cxr (as opposed to serial CT, which is way over sensitive to make clinical decisions re intervention and treatment in the elderly, who tend to tolerate both dx and treatment poorly) .   >>>> If she does progress to overt symptoms, there should be some obvious change on a  plain cxr and could be approached with BAL or just prn abx in the form of fluroquinolones initially.  Would need a baseline esr/ quant TB first for baseline    >>> If she has no symptoms over the next year, I would just follow the fleischner society guidelines as per Radiology rec:  One year with ct > placed in our reminder file.    Discussed in detail all the  indications, usual  risks and alternatives  relative to the benefits with patient who agrees to proceed with conservative f/u as outlined      Total time devoted to counseling  > 50 % of initial 60 min office visit:  review case with pt/ discussion of options/alternatives/ personally creating written customized instructions  in presence of pt  then going over those specific  Instructions directly with the pt including how to use all of the meds but in particular covering each new medication in detail and the difference between the maintenance= "automatic" meds and the prns using an action plan format for the latter (If this problem/symptom  => do that organization reading Left to right).  Please see AVS from this visit for a full list of these instructions which I personally wrote for this pt and  are unique to this visit.

## 2018-08-02 DIAGNOSIS — I129 Hypertensive chronic kidney disease with stage 1 through stage 4 chronic kidney disease, or unspecified chronic kidney disease: Secondary | ICD-10-CM | POA: Diagnosis not present

## 2018-08-02 DIAGNOSIS — E7849 Other hyperlipidemia: Secondary | ICD-10-CM | POA: Diagnosis not present

## 2018-08-02 DIAGNOSIS — R918 Other nonspecific abnormal finding of lung field: Secondary | ICD-10-CM | POA: Diagnosis not present

## 2018-08-02 DIAGNOSIS — I471 Supraventricular tachycardia: Secondary | ICD-10-CM | POA: Diagnosis not present

## 2018-08-02 DIAGNOSIS — N183 Chronic kidney disease, stage 3 (moderate): Secondary | ICD-10-CM | POA: Diagnosis not present

## 2018-08-25 DIAGNOSIS — N183 Chronic kidney disease, stage 3 (moderate): Secondary | ICD-10-CM | POA: Diagnosis not present

## 2018-09-01 DIAGNOSIS — N183 Chronic kidney disease, stage 3 (moderate): Secondary | ICD-10-CM | POA: Diagnosis not present

## 2018-09-01 DIAGNOSIS — I129 Hypertensive chronic kidney disease with stage 1 through stage 4 chronic kidney disease, or unspecified chronic kidney disease: Secondary | ICD-10-CM | POA: Diagnosis not present

## 2018-10-10 IMAGING — MG 2D DIGITAL SCREENING BILATERAL MAMMOGRAM WITH CAD AND ADJUNCT TO
8 of 13 series · 8 of 29 positions shown · non-contrast
Comparison: Previous exam(s).

CLINICAL DATA: Screening.

EXAM:
2D DIGITAL SCREENING BILATERAL MAMMOGRAM WITH CAD AND ADJUNCT TOMO

[R CC (1 of 2)]
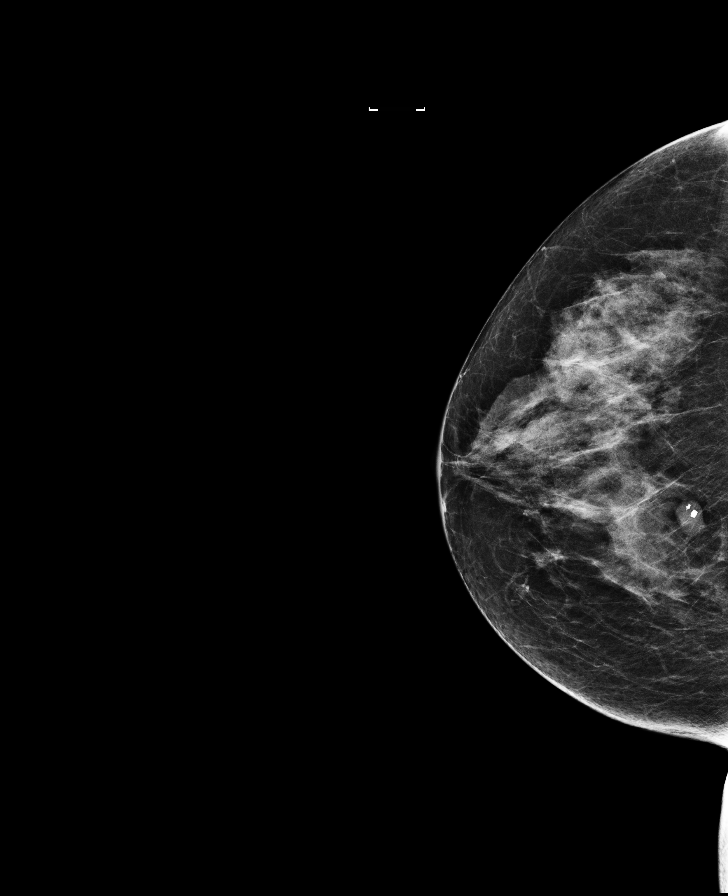

[L CC synth-2D]
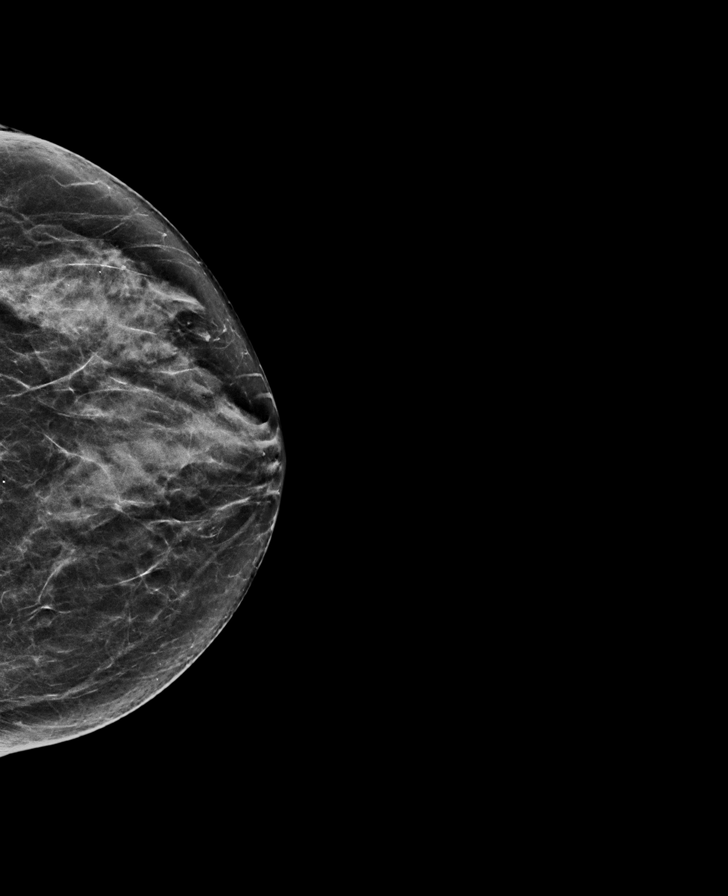

[L CC]
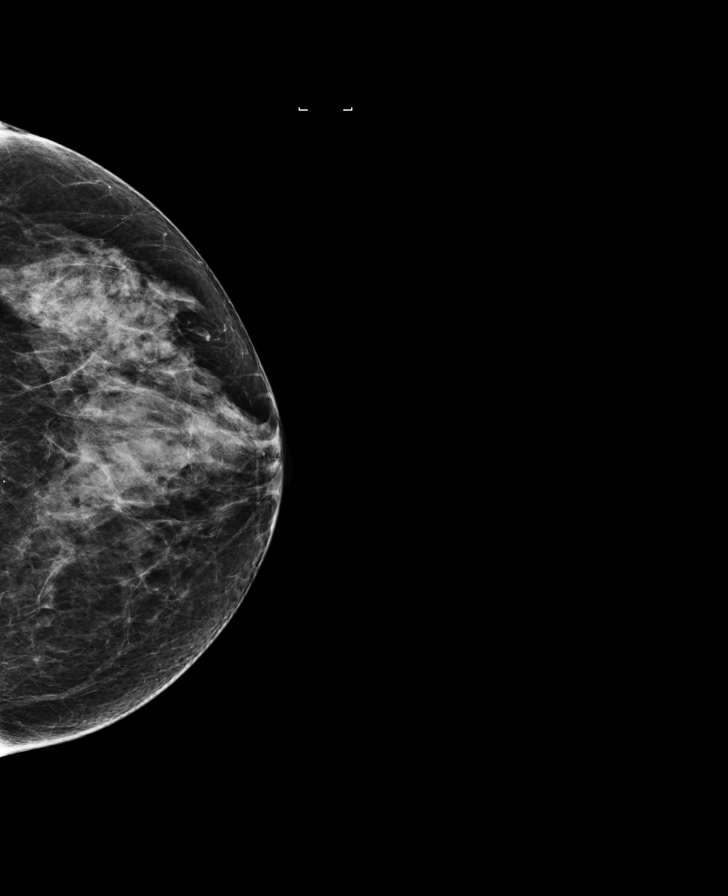

[R MLO synth-2D]
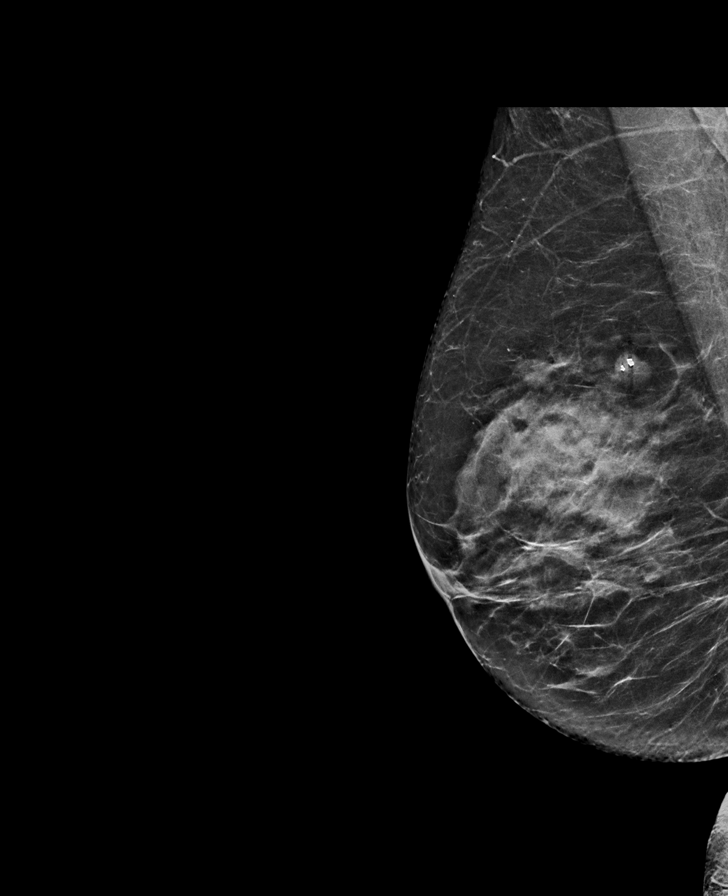

[R CC (2 of 2)]
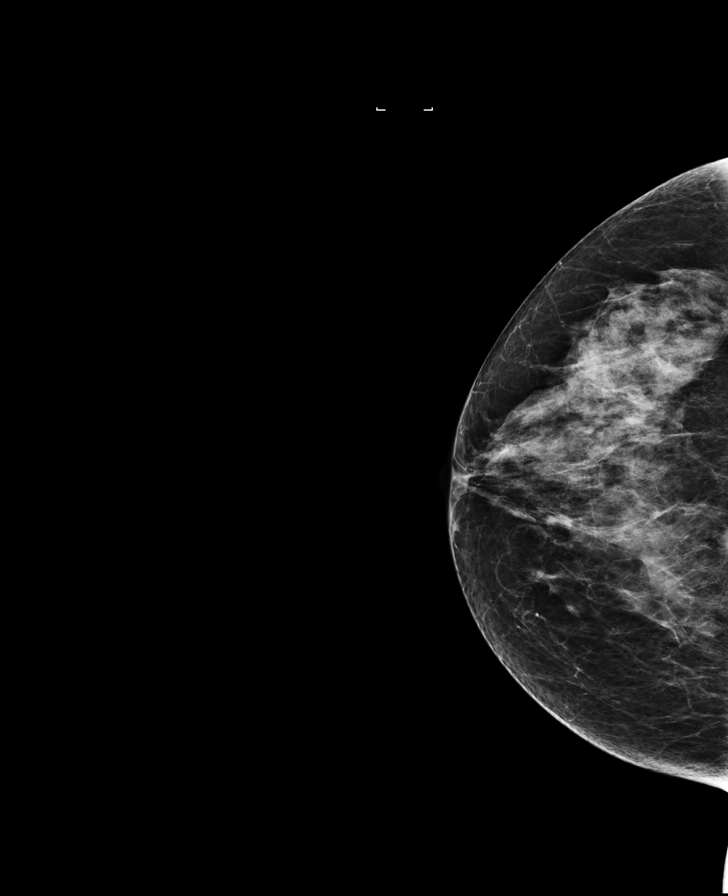

[L MLO synth-2D]
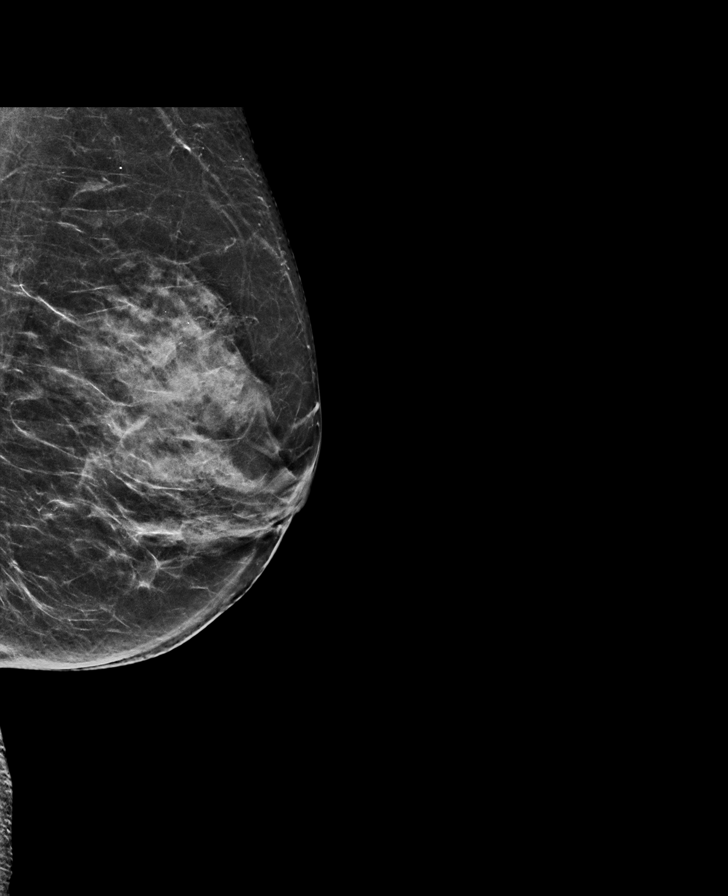

[R CC synth-2D]
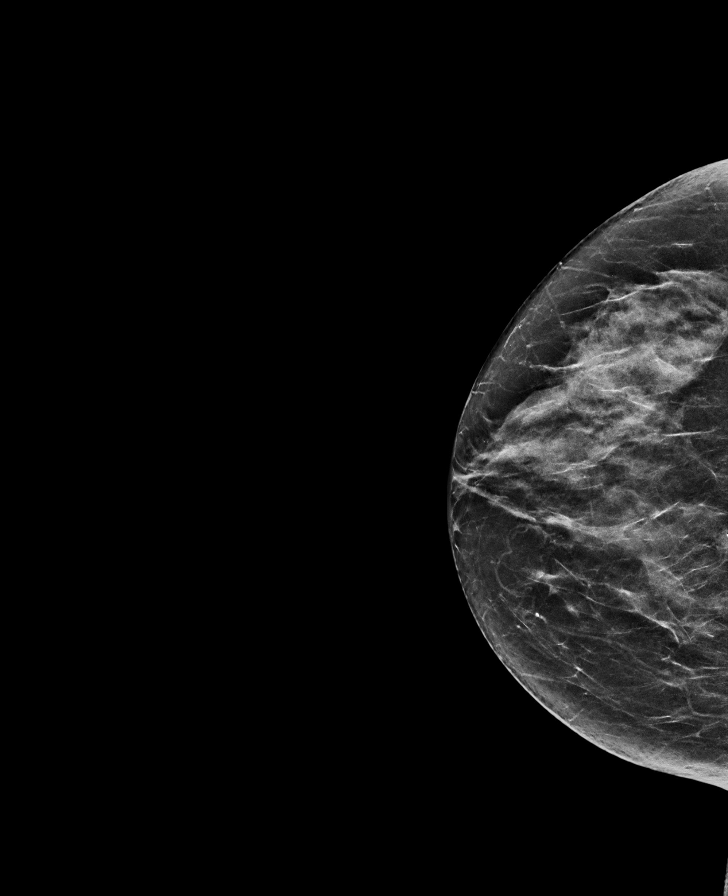

[L MLO]
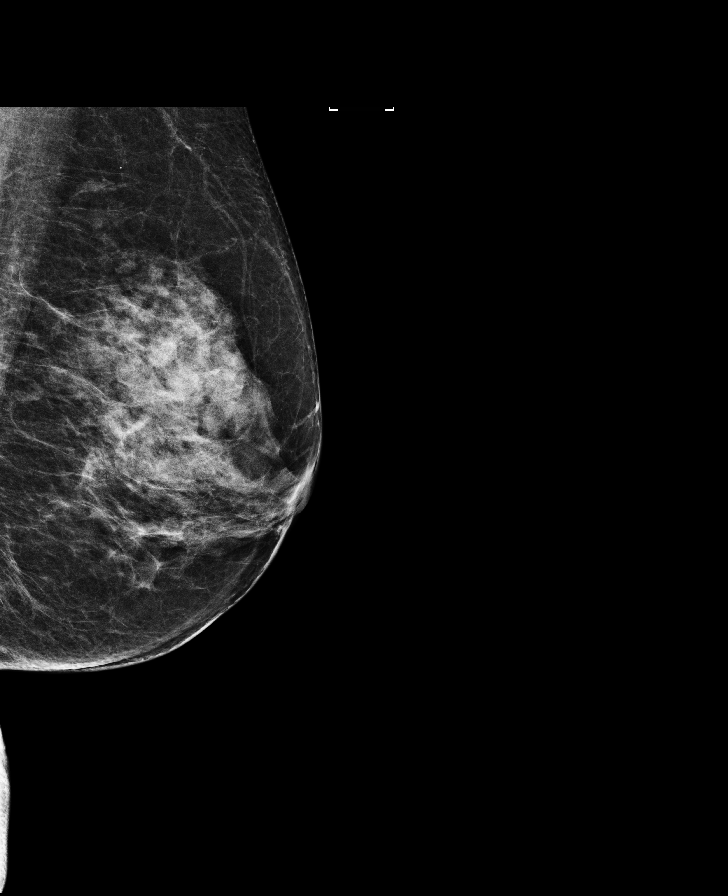

[8 of 29 positions shown; findings below may reference images not displayed]

ACR Breast Density Category c: The breast tissue is heterogeneously
dense, which may obscure small masses.
FINDINGS: There are no findings suspicious for malignancy. Images were
processed with CAD.
IMPRESSION: No mammographic evidence of malignancy. A result letter of this
screening mammogram will be mailed directly to the patient.

RECOMMENDATION:
Screening mammogram in one year. (Code:TN-0-K4T)

BI-RADS CATEGORY  1: Negative.

## 2018-10-14 DIAGNOSIS — F411 Generalized anxiety disorder: Secondary | ICD-10-CM | POA: Diagnosis not present

## 2018-10-14 DIAGNOSIS — E782 Mixed hyperlipidemia: Secondary | ICD-10-CM | POA: Diagnosis not present

## 2019-03-07 DIAGNOSIS — R918 Other nonspecific abnormal finding of lung field: Secondary | ICD-10-CM | POA: Diagnosis not present

## 2019-03-07 DIAGNOSIS — E782 Mixed hyperlipidemia: Secondary | ICD-10-CM | POA: Diagnosis not present

## 2019-03-07 DIAGNOSIS — Z6823 Body mass index (BMI) 23.0-23.9, adult: Secondary | ICD-10-CM | POA: Diagnosis not present

## 2019-03-07 DIAGNOSIS — N183 Chronic kidney disease, stage 3 (moderate): Secondary | ICD-10-CM | POA: Diagnosis not present

## 2019-03-07 DIAGNOSIS — I471 Supraventricular tachycardia: Secondary | ICD-10-CM | POA: Diagnosis not present

## 2019-03-07 DIAGNOSIS — I10 Essential (primary) hypertension: Secondary | ICD-10-CM | POA: Diagnosis not present

## 2019-03-23 DIAGNOSIS — E782 Mixed hyperlipidemia: Secondary | ICD-10-CM | POA: Diagnosis not present

## 2019-03-23 DIAGNOSIS — I10 Essential (primary) hypertension: Secondary | ICD-10-CM | POA: Diagnosis not present

## 2019-03-23 DIAGNOSIS — Z780 Asymptomatic menopausal state: Secondary | ICD-10-CM | POA: Diagnosis not present

## 2019-03-23 DIAGNOSIS — Z1231 Encounter for screening mammogram for malignant neoplasm of breast: Secondary | ICD-10-CM | POA: Diagnosis not present

## 2019-03-23 DIAGNOSIS — N183 Chronic kidney disease, stage 3 (moderate): Secondary | ICD-10-CM | POA: Diagnosis not present

## 2019-03-23 DIAGNOSIS — Z6822 Body mass index (BMI) 22.0-22.9, adult: Secondary | ICD-10-CM | POA: Diagnosis not present

## 2019-03-23 DIAGNOSIS — I471 Supraventricular tachycardia: Secondary | ICD-10-CM | POA: Diagnosis not present

## 2019-04-10 DIAGNOSIS — Z1231 Encounter for screening mammogram for malignant neoplasm of breast: Secondary | ICD-10-CM | POA: Diagnosis not present

## 2019-04-12 DIAGNOSIS — S52501A Unspecified fracture of the lower end of right radius, initial encounter for closed fracture: Secondary | ICD-10-CM | POA: Diagnosis not present

## 2019-04-12 DIAGNOSIS — T148XXA Other injury of unspecified body region, initial encounter: Secondary | ICD-10-CM | POA: Diagnosis not present

## 2019-04-16 DIAGNOSIS — S52501A Unspecified fracture of the lower end of right radius, initial encounter for closed fracture: Secondary | ICD-10-CM | POA: Diagnosis not present

## 2019-04-20 DIAGNOSIS — I10 Essential (primary) hypertension: Secondary | ICD-10-CM | POA: Diagnosis not present

## 2019-04-20 DIAGNOSIS — E782 Mixed hyperlipidemia: Secondary | ICD-10-CM | POA: Diagnosis not present

## 2019-04-20 DIAGNOSIS — E559 Vitamin D deficiency, unspecified: Secondary | ICD-10-CM | POA: Insufficient documentation

## 2019-04-20 DIAGNOSIS — Z6823 Body mass index (BMI) 23.0-23.9, adult: Secondary | ICD-10-CM | POA: Diagnosis not present

## 2019-04-30 DIAGNOSIS — S52501A Unspecified fracture of the lower end of right radius, initial encounter for closed fracture: Secondary | ICD-10-CM | POA: Diagnosis not present

## 2019-05-14 DIAGNOSIS — S52501A Unspecified fracture of the lower end of right radius, initial encounter for closed fracture: Secondary | ICD-10-CM | POA: Diagnosis not present

## 2019-05-24 DIAGNOSIS — S52501A Unspecified fracture of the lower end of right radius, initial encounter for closed fracture: Secondary | ICD-10-CM | POA: Diagnosis not present

## 2019-07-17 DIAGNOSIS — I10 Essential (primary) hypertension: Secondary | ICD-10-CM | POA: Diagnosis not present

## 2019-07-17 DIAGNOSIS — E782 Mixed hyperlipidemia: Secondary | ICD-10-CM | POA: Diagnosis not present

## 2019-07-20 DIAGNOSIS — Z6823 Body mass index (BMI) 23.0-23.9, adult: Secondary | ICD-10-CM | POA: Diagnosis not present

## 2019-07-20 DIAGNOSIS — E782 Mixed hyperlipidemia: Secondary | ICD-10-CM | POA: Diagnosis not present

## 2019-07-20 DIAGNOSIS — I10 Essential (primary) hypertension: Secondary | ICD-10-CM | POA: Diagnosis not present

## 2019-07-20 DIAGNOSIS — Z23 Encounter for immunization: Secondary | ICD-10-CM | POA: Diagnosis not present

## 2020-10-14 DIAGNOSIS — Z1211 Encounter for screening for malignant neoplasm of colon: Secondary | ICD-10-CM | POA: Insufficient documentation

## 2020-10-14 DIAGNOSIS — D123 Benign neoplasm of transverse colon: Secondary | ICD-10-CM | POA: Insufficient documentation

## 2020-10-14 DIAGNOSIS — K5730 Diverticulosis of large intestine without perforation or abscess without bleeding: Secondary | ICD-10-CM | POA: Insufficient documentation

## 2020-10-14 DIAGNOSIS — K648 Other hemorrhoids: Secondary | ICD-10-CM | POA: Insufficient documentation

## 2022-05-24 DIAGNOSIS — R300 Dysuria: Secondary | ICD-10-CM | POA: Insufficient documentation

## 2024-02-18 ENCOUNTER — Ambulatory Visit
Admission: EM | Admit: 2024-02-18 | Discharge: 2024-02-18 | Disposition: A | Discharge status: Home / Self Care | Attending: Family Medicine | Admitting: Family Medicine

## 2024-02-18 DIAGNOSIS — R55 Syncope and collapse: Secondary | ICD-10-CM | POA: Diagnosis not present

## 2024-02-18 NOTE — ED Triage Notes (Signed)
 I got up and traveled this morning from [facility] to be with my friend who has had a knee replacement, I was watching her doing her exercises, I felt myself get dizzy, I was seeing my friend in pain and the dizziness remained so I sit down, then got up and passed out (with LOC). I was just out sec's per my friends. The only thing that concerns me is when I fell, I hit the lower right side (back) of my head.

## 2024-02-18 NOTE — Discharge Instructions (Addendum)
 Your EKG was normal.  Please go to the emergency room if you have a return of dizziness or feel like you are going to pass out.

## 2024-02-18 NOTE — ED Provider Notes (Signed)
 [name]    CSN: [id number] Arrival date & time: 02/18/24  1352      History   Chief Complaint Chief Complaint  Patient presents with   Fall   Head Injury    HPI Angela Ball is a 78 y.o. female.    Fall  Head Injury  Here for a fall/syncope and closed head injury.  Earlier today she felt herself get dizzy and when she stood up she fainted briefly and found herself on the floor.  Her friend that was with her states she was out a second or 2.  She did not even know when she hit her head on the bed.  She now has a little soreness there.  She has not had any recent fever or vomiting or diarrhea and no chest pain or shortness of breath.  She does note that she had very little sleep last night, got up early to drive here from [place] (about a 3-hour drive) and only had 1 bar to eat until earlier this afternoon. She wonders if she was fatigued and had not had enough calories in.  She does not take any blood thinners  She is allergic to ampicillin's and levofloxacin and statins and quinidine.  History reviewed. No pertinent past medical history.  Patient Active Problem List   Diagnosis Date Noted   Dysuria 05/24/2022   Adenomatous polyp of transverse colon 10/14/2020   Diverticulosis of large intestine without hemorrhage 10/14/2020   Encounter for screening colonoscopy 10/14/2020   Internal hemorrhoids 10/14/2020   Vitamin D deficiency 04/20/2019   Pulmonary nodules 03/07/2019   Multiple pulmonary nodules determined by computed tomography of lung 07/26/2018   Essential hypertension 02/23/2017   Hyperlipidemia 02/23/2017   Paroxysmal SVT (supraventricular tachycardia) (HCC) 01/10/2017   Benign hypertension with CKD (chronic kidney disease) stage III (HCC) 11/14/2015   CKD (chronic kidney disease) stage 3, GFR 30-59 ml/min (HCC) 11/14/2015    History reviewed. No pertinent surgical history.  OB History   No obstetric history on file.       Home Medications    Prior to Admission medications   Medication Sig Start Date End Date Taking? Authorizing Provider  Biotin (BIOTIN MAXIMUM STRENGTH) 10 MG TABS Take 5,000 mg by mouth. 08/02/18  Yes [provider]  lisinopril (ZESTRIL) 20 MG tablet Take 20 mg by mouth daily. 06/23/22  Yes [provider]  LORazepam (ATIVAN) 0.5 MG tablet Take 0.5 mg by mouth as directed. 07/06/23 07/05/24 Yes [provider]  Biotin 10 MG TABS Take 1 tablet by mouth daily.    [provider]  Cholecalciferol (CVS D3) 50 MCG (2000 UT) CAPS Take 2,000 Units by mouth daily.    [provider]  cloNIDine (CATAPRES) 0.1 MG tablet Take 0.1 mg by mouth 2 (two) times daily.    [provider]  Coenzyme Q10 (CO Q 10) 100 MG CAPS Take 200 mg by mouth daily.    [provider]  ESCITALOPRAM OXALATE PO Take 1 tablet by mouth daily.    [provider]  ezetimibe (ZETIA) 10 MG tablet Take 10 mg by mouth daily.    [provider]  metoprolol succinate (TOPROL-XL) 25 MG 24 hr tablet Take 25 mg by mouth daily.    [provider]  Multiple Vitamins-Minerals (HAIR SKIN AND NAILS FORMULA PO) Take by mouth daily.    [provider]  Omega-3 Fatty Acids (SALMON OIL PO) Take 1 capsule by mouth daily.  [provider]  rosuvastatin (CRESTOR) 5 MG tablet Take 5 mg by mouth daily.    [provider]    Family History History reviewed. No pertinent family history.  Social History Social History   Tobacco Use   Smoking status: Never    Passive exposure: Never   Smokeless tobacco: Never  Vaping Use   Vaping status: Never Used  Substance Use Topics   Alcohol use: Not Currently   Drug use: Never     Allergies   Ampicillin, Latex, Levofloxacin, Penicillin g, Quinidine, and Statins   Review of Systems Review of Systems   Physical Exam Triage Vital Signs ED Triage Vitals  Encounter Vitals  Group     BP 02/18/24 1406 (!) 176/69     Girls Systolic BP Percentile --      Girls Diastolic BP Percentile --      Boys Systolic BP Percentile --      Boys Diastolic BP Percentile --      Pulse Rate 02/18/24 1406 99     Resp 02/18/24 1406 18     Temp 02/18/24 1406 (!) 97.5 F (36.4 C)     Temp Source 02/18/24 1406 Oral     SpO2 02/18/24 1406 98 %     Weight 02/18/24 1404 137 lb (62.1 kg)     Height 02/18/24 1404 5' 4 (1.626 m)     Head Circumference --      Peak Flow --      Pain Score 02/18/24 1400 0     Pain Loc --      Pain Education --      Exclude from Growth Chart --    No data found.  Updated Vital Signs BP (!) 176/69 (BP Location: Right Arm)   Pulse 99   Temp (!) 97.5 F (36.4 C) (Oral)   Resp 18   Ht 5' 4 (1.626 m)   Wt 62.1 kg   SpO2 98%   BMI 23.52 kg/m   Visual Acuity Right Eye Distance:   Left Eye Distance:   Bilateral Distance:    Right Eye Near:   Left Eye Near:    Bilateral Near:     Physical Exam Vitals reviewed.  Constitutional:      General: She is not in acute distress.    Appearance: She is not toxic-appearing.  HENT:     Head:     Comments: There is a little raised swelling and ecchymosis about 3 cm in diameter on her right occiput.  No fluctuance and no bleeding.    Nose: Nose normal.     Mouth/Throat:     Mouth: Mucous membranes are moist.  Eyes:     Extraocular Movements: Extraocular movements intact.     Conjunctiva/sclera: Conjunctivae normal.     Pupils: Pupils are equal, round, and reactive to light.  Cardiovascular:     Rate and Rhythm: Normal rate and regular rhythm.     Heart sounds: No murmur heard. Pulmonary:     Effort: Pulmonary effort is normal. No respiratory distress.     Breath sounds: No stridor. No wheezing, rhonchi or rales.  Musculoskeletal:     Cervical back: Neck supple.  Lymphadenopathy:     Cervical: No cervical adenopathy.  Skin:    Capillary Refill: Capillary refill takes less than 2 seconds.      Coloration: Skin is not jaundiced or pale.  Neurological:     General: No focal deficit present.  Mental Status: She is alert and oriented to person, place, and time.  Psychiatric:        Behavior: Behavior normal.      UC Treatments / Results  Labs (all labs ordered are listed, but only abnormal results are displayed) Labs Reviewed - No data to display  EKG   Radiology No results found.  Procedures Procedures (including critical care time)  Medications Ordered in UC Medications - No data to display  Initial Impression / Assessment and Plan / UC Course  I have reviewed the triage vital signs and the nursing notes.  Pertinent labs & imaging results that were available during my care of the patient were reviewed by me and considered in my medical decision making (see chart for details).     EKG is benign without any ST segment elevation and no arrhythmia.  I had discussed with her considering going to the emergency room, but when she brought up that she had had very little calories this morning and may have even been a little dry due to the drive from here and restricting her fluids, we decided against an ER visit at this time.  She has eaten a meal since this fall and is feeling no symptoms that she did this morning right before she passed out.  She will go to the emergency room if she has return of any dizziness. Final Clinical Impressions(s) / UC Diagnoses   Final diagnoses:  None     Discharge Instructions      Your EKG was normal.  Please go to the emergency room if you have a return of dizziness or feel like you are going to pass out.     ED Prescriptions   None    PDMP not reviewed this encounter.   Vonna Sharlet POUR, MD 02/18/24 1430
